# Patient Record
Sex: Male | Born: 2000 | Race: White | Hispanic: No | Marital: Single | Smoking: Current some day smoker
Health system: Southern US, Community
[De-identification: ages and names within clinical notes are randomized; demographics above are authoritative.]

---

## 2019-06-01 ENCOUNTER — Other Ambulatory Visit: Payer: Self-pay

## 2019-06-01 ENCOUNTER — Ambulatory Visit: Payer: Self-pay | Attending: Internal Medicine

## 2019-06-01 DIAGNOSIS — Z23 Encounter for immunization: Secondary | ICD-10-CM

## 2019-06-01 NOTE — Progress Notes (Signed)
   Covid-19 Vaccination Clinic  Name:  Tom Schwartz    MRN: [id number] DOB: [birth year]  06/01/2019  [name] was observed post Covid-19 immunization for 15 minutes without incident. He was provided with Vaccine Information Sheet and instruction to access the V-Safe system.   [name] was instructed to call 911 with any severe reactions post vaccine: Marland Kitchen Difficulty breathing  . Swelling of face and throat  . A fast heartbeat  . A bad rash all over body  . Dizziness and weakness   Immunizations Administered    Name Date Dose VIS Date Route   Pfizer COVID-19 Vaccine 06/01/2019  6:56 PM 0.3 mL 02/20/2019 Intramuscular   Manufacturer: [employer], [employer]   Lot: [id number]   NDC: [address]-2

## 2019-06-22 ENCOUNTER — Ambulatory Visit: Payer: Self-pay | Attending: Internal Medicine

## 2019-06-22 DIAGNOSIS — Z23 Encounter for immunization: Secondary | ICD-10-CM

## 2019-06-22 NOTE — Progress Notes (Signed)
   Covid-19 Vaccination Clinic  Name:  Gilbert Manolis    MRN: [id number] DOB: Jul 27, 2000  06/22/2019  Mr. Sisneros was observed post Covid-19 immunization for 15 minutes without incident. He was provided with Vaccine Information Sheet and instruction to access the V-Safe system.   Mr. Siegel was instructed to call 911 with any severe reactions post vaccine: Marland Kitchen Difficulty breathing  . Swelling of face and throat  . A fast heartbeat  . A bad rash all over body  . Dizziness and weakness   Immunizations Administered    Name Date Dose VIS Date Route   Pfizer COVID-19 Vaccine 06/22/2019  6:52 PM 0.3 mL 02/20/2019 Intramuscular   Manufacturer: [employer], [employer]   Lot: [id number]   NDC: [address]-7

## 2019-07-16 ENCOUNTER — Encounter: Payer: Self-pay | Admitting: *Deleted

## 2019-07-16 ENCOUNTER — Other Ambulatory Visit: Payer: Self-pay

## 2019-07-16 DIAGNOSIS — Y999 Unspecified external cause status: Secondary | ICD-10-CM | POA: Insufficient documentation

## 2019-07-16 DIAGNOSIS — W25XXXA Contact with sharp glass, initial encounter: Secondary | ICD-10-CM | POA: Insufficient documentation

## 2019-07-16 DIAGNOSIS — Y939 Activity, unspecified: Secondary | ICD-10-CM | POA: Diagnosis not present

## 2019-07-16 DIAGNOSIS — F17210 Nicotine dependence, cigarettes, uncomplicated: Secondary | ICD-10-CM | POA: Diagnosis not present

## 2019-07-16 DIAGNOSIS — S81812A Laceration without foreign body, left lower leg, initial encounter: Secondary | ICD-10-CM | POA: Diagnosis present

## 2019-07-16 DIAGNOSIS — Y929 Unspecified place or not applicable: Secondary | ICD-10-CM | POA: Diagnosis not present
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
## 2019-07-17 ENCOUNTER — Emergency Department
Admission: EM | Admit: 2019-07-17 | Discharge: 2019-07-17 | Disposition: A | Discharge status: Home / Self Care | Payer: 59 | Attending: Emergency Medicine | Admitting: Emergency Medicine

## 2019-07-17 ENCOUNTER — Emergency Department: Payer: 59

## 2019-07-17 ENCOUNTER — Encounter: Payer: Self-pay | Admitting: Emergency Medicine

## 2019-07-17 DIAGNOSIS — S81812A Laceration without foreign body, left lower leg, initial encounter: Secondary | ICD-10-CM

## 2019-07-17 MED ORDER — CEPHALEXIN 500 MG PO CAPS: 500.0000 mg | ORAL_CAPSULE | Freq: Three times a day (TID) | ORAL | 0 refills | Status: AC

## 2019-07-17 MED ORDER — CEPHALEXIN 500 MG PO CAPS
500.0000 mg | ORAL_CAPSULE | Freq: Once | ORAL | Status: AC
  Administered 2019-07-17: 500 mg via ORAL
  Filled 2019-07-17: qty 1

## 2019-07-17 MED ORDER — LIDOCAINE-EPINEPHRINE 2 %-1:100000 IJ SOLN
30.0000 mL | Freq: Once | INTRAMUSCULAR | Status: AC
  Administered 2019-07-17: 30 mL
  Filled 2019-07-17: qty 2
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
## 2021-08-25 IMAGING — DX DG TIBIA/FIBULA 2V*L*
4 series · 4 of 4 positions shown · non-contrast
Comparison: None.

CLINICAL DATA: Laceration from glass

EXAM:
LEFT TIBIA AND FIBULA - 2 VIEW

[tibia ap (1 of 2)]
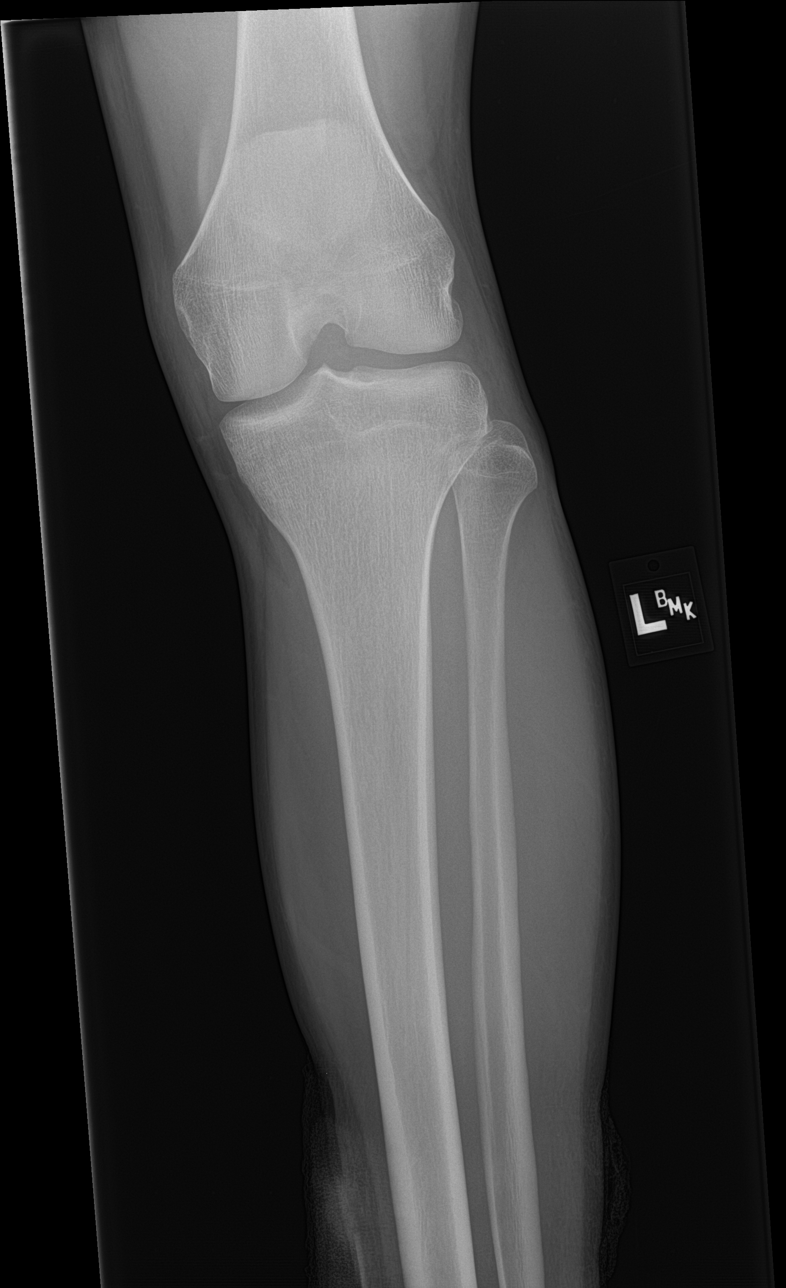

[tibia ap (2 of 2)]
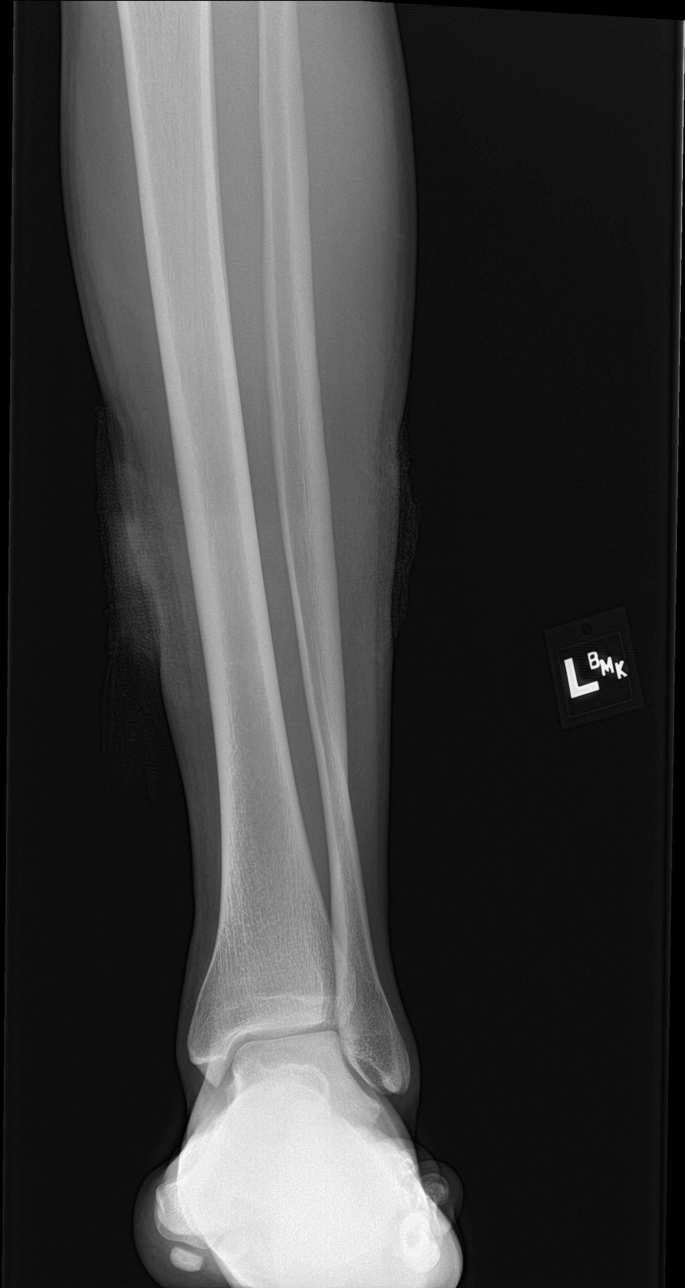

[tibia lat (1 of 2)]
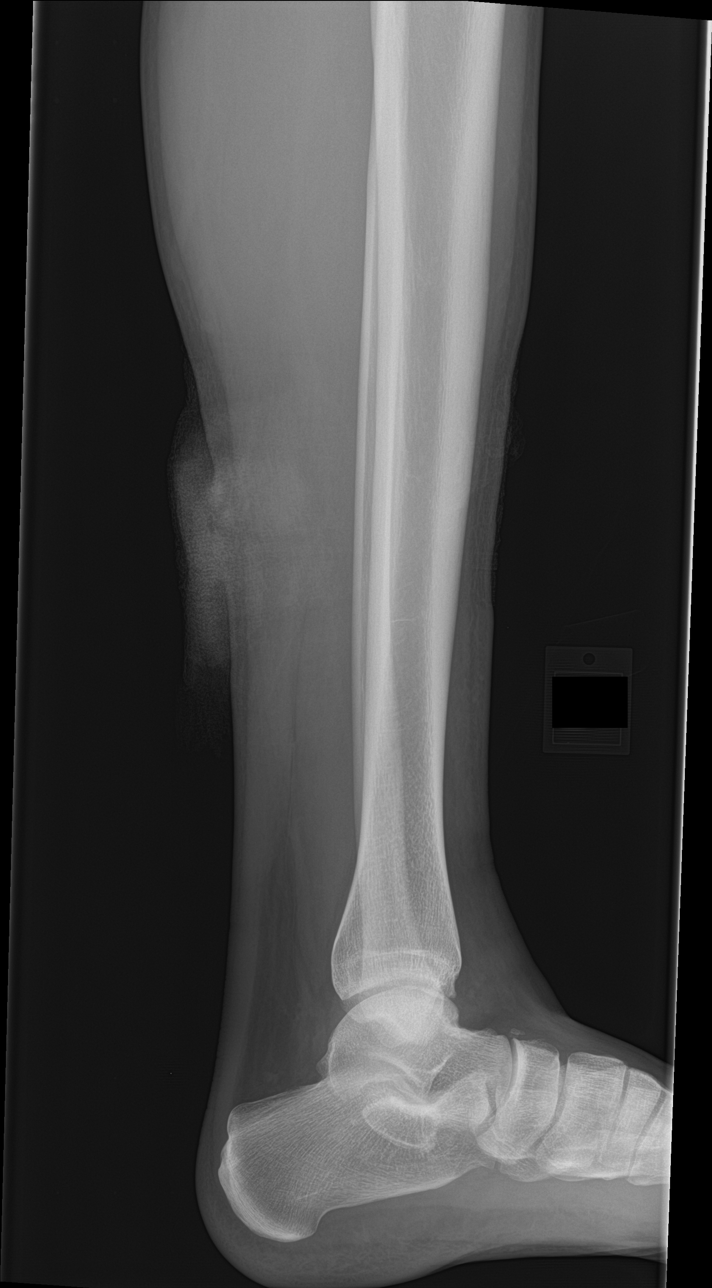

[tibia lat (2 of 2)]
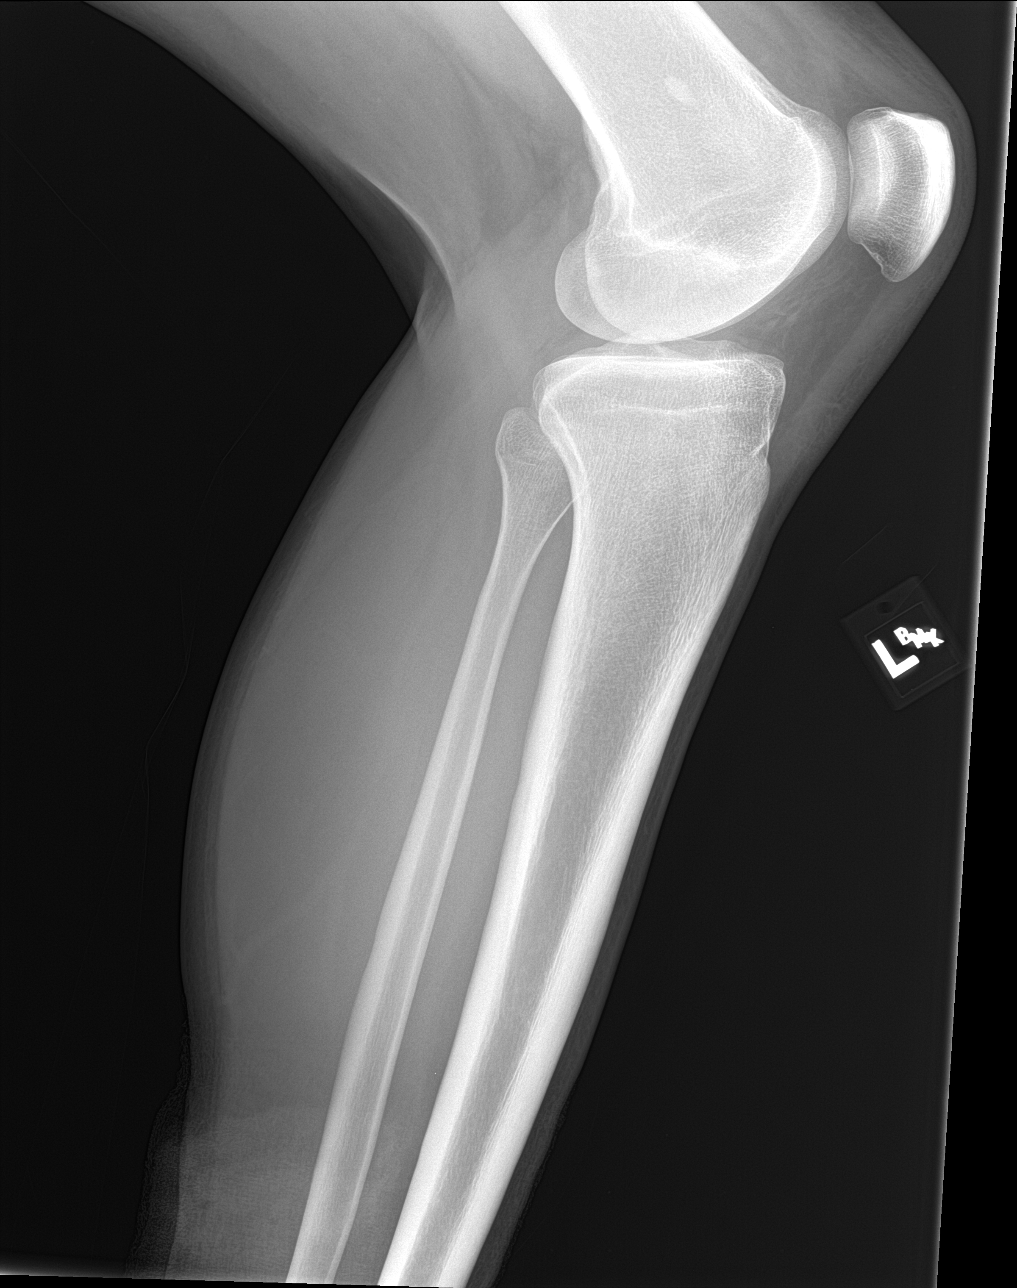

[4 of 4 positions shown; findings below may reference images not displayed]

FINDINGS: Soft tissue defect along the posterior soft tissues of the lower
leg. Overlying bandaging material is present. No radiopaque foreign
body or soft tissue gas is seen.

No acute osseous abnormality or suspicious osseous lesion.
Corticated fragment superior to the talonavicular could reflect
prior injury or ossicle. No ankle effusion or swelling.
IMPRESSION: Soft tissue defect along the posterior soft tissues of the lower leg
without radiopaque foreign body or soft tissue gas.
